# Patient Record
Sex: Male | Born: 1995 | Race: White | Hispanic: No | Marital: Single | Smoking: Never smoker
Health system: Southern US, Community
[De-identification: ages and names within clinical notes are randomized; demographics above are authoritative.]

## PROBLEM LIST (undated history)

## (undated) DIAGNOSIS — S62617B Displaced fracture of proximal phalanx of left little finger, initial encounter for open fracture: Secondary | ICD-10-CM

## (undated) DIAGNOSIS — Z9229 Personal history of other drug therapy: Secondary | ICD-10-CM

## (undated) DIAGNOSIS — F429 Obsessive-compulsive disorder, unspecified: Secondary | ICD-10-CM

## (undated) DIAGNOSIS — Z8719 Personal history of other diseases of the digestive system: Secondary | ICD-10-CM

## (undated) DIAGNOSIS — F411 Generalized anxiety disorder: Secondary | ICD-10-CM

## (undated) DIAGNOSIS — Z973 Presence of spectacles and contact lenses: Secondary | ICD-10-CM

## (undated) DIAGNOSIS — F952 Tourette's disorder: Secondary | ICD-10-CM

## (undated) HISTORY — PX: TYMPANOSTOMY TUBE PLACEMENT: SHX32

---
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
## 2017-06-29 DIAGNOSIS — F4320 Adjustment disorder, unspecified: Secondary | ICD-10-CM | POA: Diagnosis not present

## 2017-08-01 DIAGNOSIS — S62617D Displaced fracture of proximal phalanx of left little finger, subsequent encounter for fracture with routine healing: Secondary | ICD-10-CM | POA: Diagnosis not present

## 2017-08-29 DIAGNOSIS — H52223 Regular astigmatism, bilateral: Secondary | ICD-10-CM | POA: Diagnosis not present

## 2018-04-24 DIAGNOSIS — H52223 Regular astigmatism, bilateral: Secondary | ICD-10-CM | POA: Diagnosis not present

## 2019-01-29 DIAGNOSIS — H52223 Regular astigmatism, bilateral: Secondary | ICD-10-CM | POA: Diagnosis not present

## 2020-08-26 ENCOUNTER — Other Ambulatory Visit: Payer: Self-pay

## 2020-08-26 ENCOUNTER — Encounter (HOSPITAL_BASED_OUTPATIENT_CLINIC_OR_DEPARTMENT_OTHER): Payer: Self-pay | Admitting: Emergency Medicine

## 2020-08-26 DIAGNOSIS — Y92410 Unspecified street and highway as the place of occurrence of the external cause: Secondary | ICD-10-CM | POA: Insufficient documentation

## 2020-08-26 DIAGNOSIS — S161XXA Strain of muscle, fascia and tendon at neck level, initial encounter: Secondary | ICD-10-CM | POA: Insufficient documentation

## 2020-08-26 DIAGNOSIS — S0990XA Unspecified injury of head, initial encounter: Secondary | ICD-10-CM | POA: Insufficient documentation
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
## 2020-08-27 ENCOUNTER — Emergency Department (HOSPITAL_BASED_OUTPATIENT_CLINIC_OR_DEPARTMENT_OTHER): Payer: Self-pay

## 2020-08-27 ENCOUNTER — Emergency Department (HOSPITAL_BASED_OUTPATIENT_CLINIC_OR_DEPARTMENT_OTHER)
Admission: EM | Admit: 2020-08-27 | Discharge: 2020-08-27 | Disposition: A | Discharge status: Home / Self Care | Payer: Self-pay | Attending: Emergency Medicine | Admitting: Emergency Medicine

## 2020-08-27 DIAGNOSIS — S161XXA Strain of muscle, fascia and tendon at neck level, initial encounter: Secondary | ICD-10-CM

## 2020-08-27 DIAGNOSIS — S0990XA Unspecified injury of head, initial encounter: Secondary | ICD-10-CM

## 2020-08-27 MED ORDER — IBUPROFEN 800 MG PO TABS: 800.0000 mg | ORAL_TABLET | Freq: Three times a day (TID) | ORAL | 0 refills | Status: AC | PRN

## 2020-08-27 MED ORDER — METHOCARBAMOL 500 MG PO TABS: 500.0000 mg | ORAL_TABLET | Freq: Three times a day (TID) | ORAL | 0 refills | Status: AC | PRN

## 2020-08-27 MED ORDER — KETOROLAC TROMETHAMINE 30 MG/ML IJ SOLN
30.0000 mg | Freq: Once | INTRAMUSCULAR | Status: AC
  Administered 2020-08-27: 30 mg via INTRAMUSCULAR
  Filled 2020-08-27: qty 1

## 2020-08-27 MED ORDER — DICLOFENAC SODIUM 1 % EX GEL: 2.0000 g | Freq: Four times a day (QID) | CUTANEOUS | 0 refills | Status: AC | PRN

## 2020-08-27 NOTE — Discharge Instructions (Signed)
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
